# Patient Record
Sex: Male | Born: 1955 | Race: White | Hispanic: No | Marital: Married | Smoking: Current every day smoker
Health system: Southern US, Community
[De-identification: ages and names within clinical notes are randomized; demographics above are authoritative.]

---

## 2011-12-10 ENCOUNTER — Encounter (HOSPITAL_COMMUNITY): Payer: Self-pay | Admitting: Emergency Medicine

## 2011-12-10 ENCOUNTER — Emergency Department (HOSPITAL_COMMUNITY)
Admission: EM | Admit: 2011-12-10 | Discharge: 2011-12-10 | Disposition: A | Discharge status: Home / Self Care | Payer: BC Managed Care – PPO | Attending: Emergency Medicine | Admitting: Emergency Medicine

## 2011-12-10 ENCOUNTER — Emergency Department (HOSPITAL_COMMUNITY): Payer: BC Managed Care – PPO

## 2011-12-10 DIAGNOSIS — S82899A Other fracture of unspecified lower leg, initial encounter for closed fracture: Secondary | ICD-10-CM

## 2011-12-10 DIAGNOSIS — F17200 Nicotine dependence, unspecified, uncomplicated: Secondary | ICD-10-CM | POA: Insufficient documentation

## 2011-12-10 DIAGNOSIS — S82853A Displaced trimalleolar fracture of unspecified lower leg, initial encounter for closed fracture: Secondary | ICD-10-CM

## 2011-12-10 DIAGNOSIS — W1789XA Other fall from one level to another, initial encounter: Secondary | ICD-10-CM | POA: Insufficient documentation

## 2011-12-10 MED ORDER — OXYCODONE-ACETAMINOPHEN 10-650 MG PO TABS: 1.0000 | ORAL_TABLET | Freq: Four times a day (QID) | ORAL | Status: AC | PRN

## 2011-12-10 MED ORDER — KETAMINE HCL 10 MG/ML IJ SOLN
10.0000 mg | INTRAMUSCULAR | Status: AC
  Administered 2011-12-10: 50 mg via INTRAVENOUS
  Filled 2011-12-10: qty 1

## 2011-12-10 MED ORDER — HYDROMORPHONE HCL PF 1 MG/ML IJ SOLN
1.0000 mg | Freq: Once | INTRAMUSCULAR | Status: AC
  Administered 2011-12-10: 1 mg via INTRAVENOUS
  Filled 2011-12-10: qty 1

## 2011-12-10 MED ORDER — PROPOFOL 10 MG/ML IV EMUL
INTRAVENOUS | Status: AC
  Administered 2011-12-10: 50 mg/h via INTRAVENOUS
  Filled 2011-12-10: qty 20

## 2011-12-10 MED ORDER — ETOMIDATE 2 MG/ML IV SOLN: 10.0000 mg | Freq: Once | INTRAVENOUS | Status: DC

## 2011-12-10 MED ORDER — ETOMIDATE 2 MG/ML IV SOLN
INTRAVENOUS | Status: AC
  Filled 2011-12-10: qty 10

## 2011-12-10 MED ORDER — PROPOFOL 10 MG/ML IV EMUL
5.0000 mL | Freq: Once | INTRAVENOUS | Status: AC
  Administered 2011-12-10: 50 mg/h via INTRAVENOUS

## 2011-12-10 MED ORDER — KETAMINE HCL 50 MG/ML IJ SOLN
10.0000 mg | Freq: Once | INTRAMUSCULAR | Status: DC
  Filled 2011-12-10: qty 0.2

## 2011-12-10 NOTE — ED Notes (Signed)
Ortho tech called for crutches 

## 2011-12-10 NOTE — ED Notes (Signed)
Ortho tech called to come down and be on stand by

## 2011-12-10 NOTE — ED Notes (Signed)
NWG:[id number]<ZH> Expected date:12/10/11<BR> Expected time:12:30 PM<BR> Means of arrival:Ambulance<BR> Comments:<BR> Rock 3. 56 yo m. Ankle fx from fall of 2nd rung off ladder. No other trauma, no head/neck/back injury. 10mg  of morphine on. 20 min eta

## 2011-12-10 NOTE — ED Notes (Signed)
Xray at bedside to obtain portable post reduction films.

## 2011-12-10 NOTE — ED Notes (Signed)
Pt fell off 2 step of a ladder and has a deformity to the rt ankle and the foot is rotated, strong pedal pulses, foot is cold to touch, lt hand iv 20g, given morphine 10mg  iv, no loc, lsb c collar, closed fracture at this time but is beginning to pierce the skin

## 2011-12-10 NOTE — ED Provider Notes (Signed)
History     CSN: [id number]  Arrival date & time 12/10/11  1225   First MD Initiated Contact with Patient 12/10/11 1235      Chief Complaint  Patient presents with  . Ankle Pain     HPI Pt fell off 2 step of a ladder and has a deformity to the rt ankle and the foot is rotated, strong pedal pulses, foot is cold to touch, lt hand iv 20g, given morphine 10mg  iv, no loc, lsb c collar, closed fracture at this time but is beginning to pierce the skin   History reviewed. No pertinent past medical history.  History reviewed. No pertinent past surgical history.  No family history on file.  History  Substance Use Topics  . Smoking status: Current Everyday Smoker  . Smokeless tobacco: Not on file  . Alcohol Use:       Review of Systems Level V caveat applies because of need for urgent intervention Allergies  Cephalosporins and Other  Home Medications   Current Outpatient Rx  Name Route Sig Dispense Refill  . OXYCODONE-ACETAMINOPHEN 10-650 MG PO TABS Oral Take 1 tablet by mouth every 6 (six) hours as needed for pain. 30 tablet 0    BP 152/84  Pulse 82  Temp(Src) 98.3 F (36.8 C) (Oral)  Resp 13  Wt 150 lb (68.04 kg)  SpO2 99%  Physical Exam  Nursing note and vitals reviewed. Constitutional: He is oriented to person, place, and time. He appears well-developed and well-nourished. No distress.  HENT:  Head: Normocephalic and atraumatic.  Eyes: Pupils are equal, round, and reactive to light.  Neck: Normal range of motion.  Cardiovascular: Normal rate and intact distal pulses.   Pulmonary/Chest: No respiratory distress.  Abdominal: Normal appearance. He exhibits no distension.  Musculoskeletal: Normal range of motion.       Feet:  Neurological: He is alert and oriented to person, place, and time. No cranial nerve deficit.  Skin: Skin is warm and dry. No rash noted.  Psychiatric: He has a normal mood and affect. His behavior is normal.    ED Course  Reduction of  dislocation Performed by: Nelia Shi Authorized by: Nelia Shi Consent: Verbal consent obtained. Written consent not obtained. Risks and benefits: risks, benefits and alternatives were discussed Consent given by: patient Patient understanding: patient states understanding of the procedure being performed Patient consent: the patient's understanding of the procedure matches consent given Procedure consent: procedure consent matches procedure scheduled Relevant documents: relevant documents present and verified Imaging studies: imaging studies available Patient identity confirmed: verbally with patient and arm band Time out: Immediately prior to procedure a "time out" was called to verify the correct patient, procedure, equipment, support staff and site/side marked as required. Local anesthesia used: no Patient sedated: yes Sedation type: moderate (conscious) sedation Sedatives: ketamine and propofol Vitals: Vital signs were monitored during sedation. Patient tolerance: Patient tolerated the procedure well with no immediate complications. Comments: Reduction successful.  Postreduction films viewed via orthopedic Dr. and appear acceptable.  Trimalleolar fracture appears present.  Foot and ankle placed in posterior plaster splint with plaster stirrups.  Crutches to be applied.   (including critical care time)  Labs Reviewed - No data to display Dg Ankle Complete Right  12/10/2011  *RADIOLOGY REPORT*  Clinical Data: Post reduction right ankle fracture.  RIGHT ANKLE - COMPLETE 3+ VIEW  Comparison: None.  Findings: Three-view exam shows fine bony detail obscured by the overlying plaster.  The the patient has  a fracture involving the medial malleolus and distal fibula.  There may be a posterior lip fracture of the distal tibia as well, but detail is not well demonstrated on this study.  There is some slight lateral subluxation of the talar dome relative to the distal tibia.  IMPRESSION:  Probable trimalleolar fracture status post closed reduction.  Fine bony detail obscured by the overlying plaster.  Original Report Authenticated By: ERIC A. MANSELL, M.D.     1. Trimalleolar fracture   2. Fracture dislocation of ankle       MDM         Nelia Shi, MD 12/10/11 [phone]

## 2011-12-10 NOTE — ED Notes (Signed)
Pt alert and oriented, still says a few silly things from medicine for moderate sedation. But stewart score 6. Respirations even and unlabored, bilateral symmetrical rise and fall of chest. Skin warm and dry. In no acute distress. Denies needs.

## 2013-03-11 IMAGING — CR DG ANKLE COMPLETE 3+V*R*
1 series · 3 of 3 positions shown · non-contrast
Comparison: None.

CLINICAL DATA: Post reduction right ankle fracture.

RIGHT ANKLE - COMPLETE 3+ VIEW

[Series 1: AP · right · 3 of 3 slices shown]
[im 1/3]
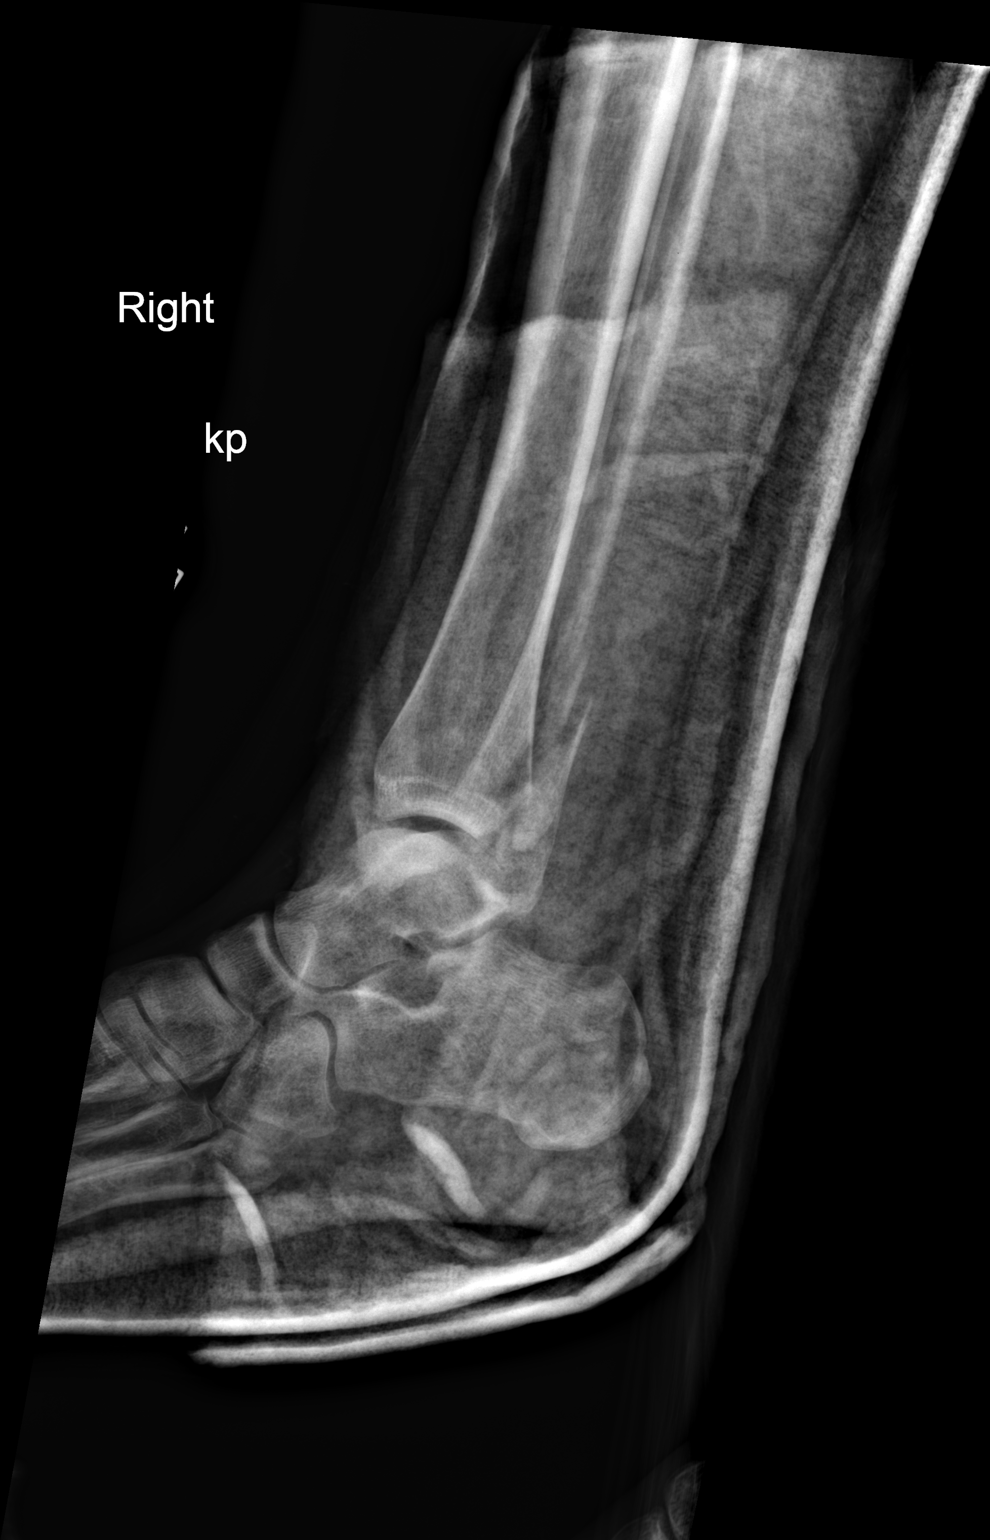
[im 2/3]
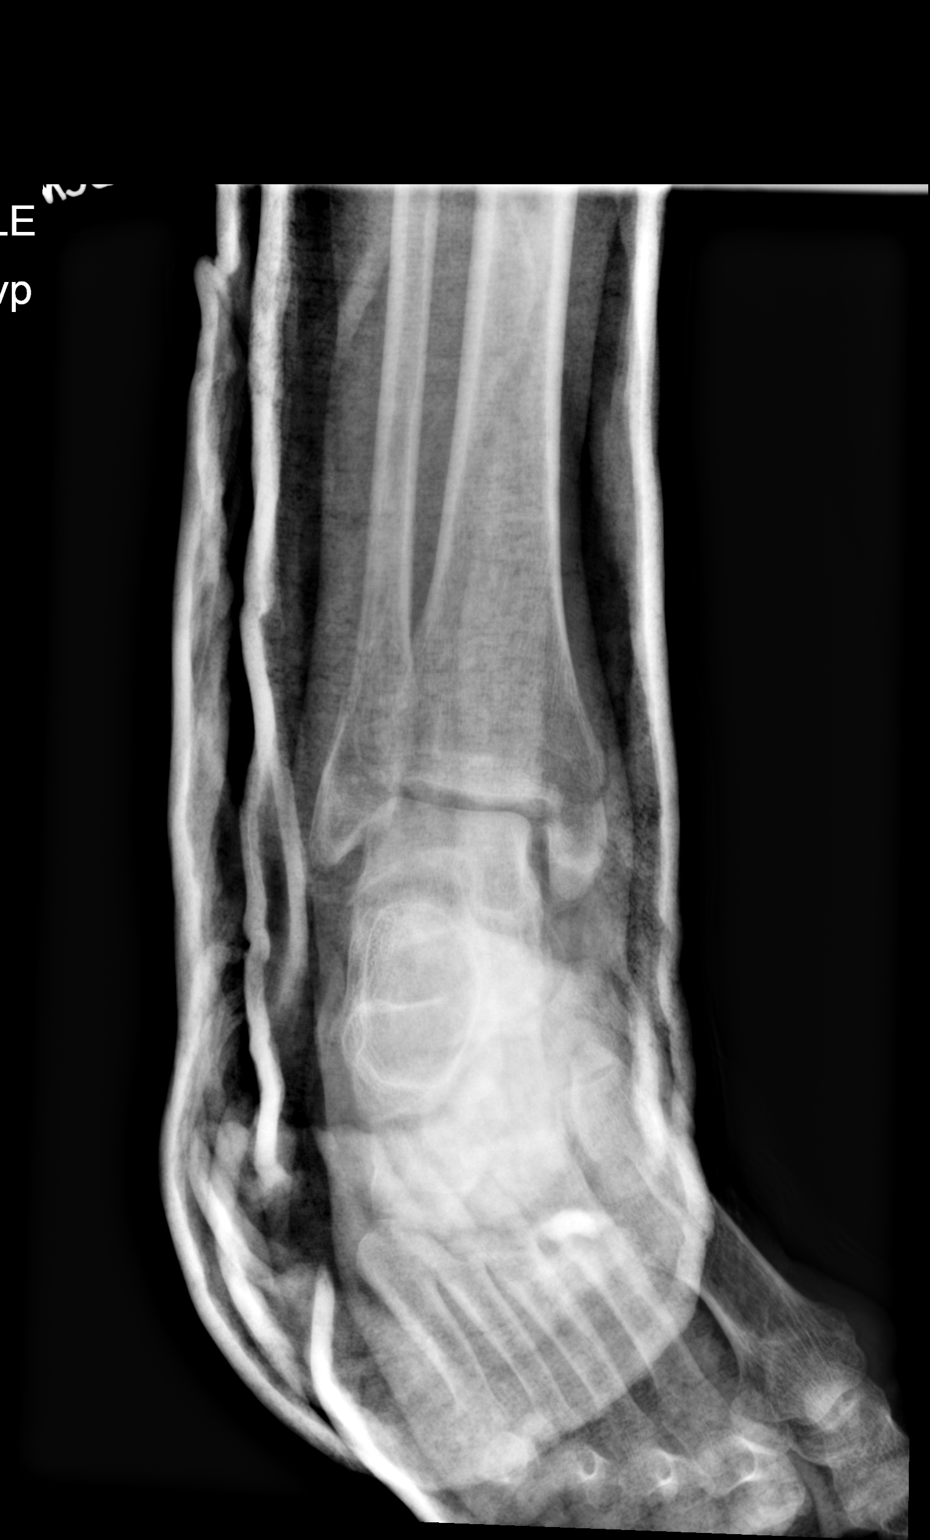
[im 3/3]
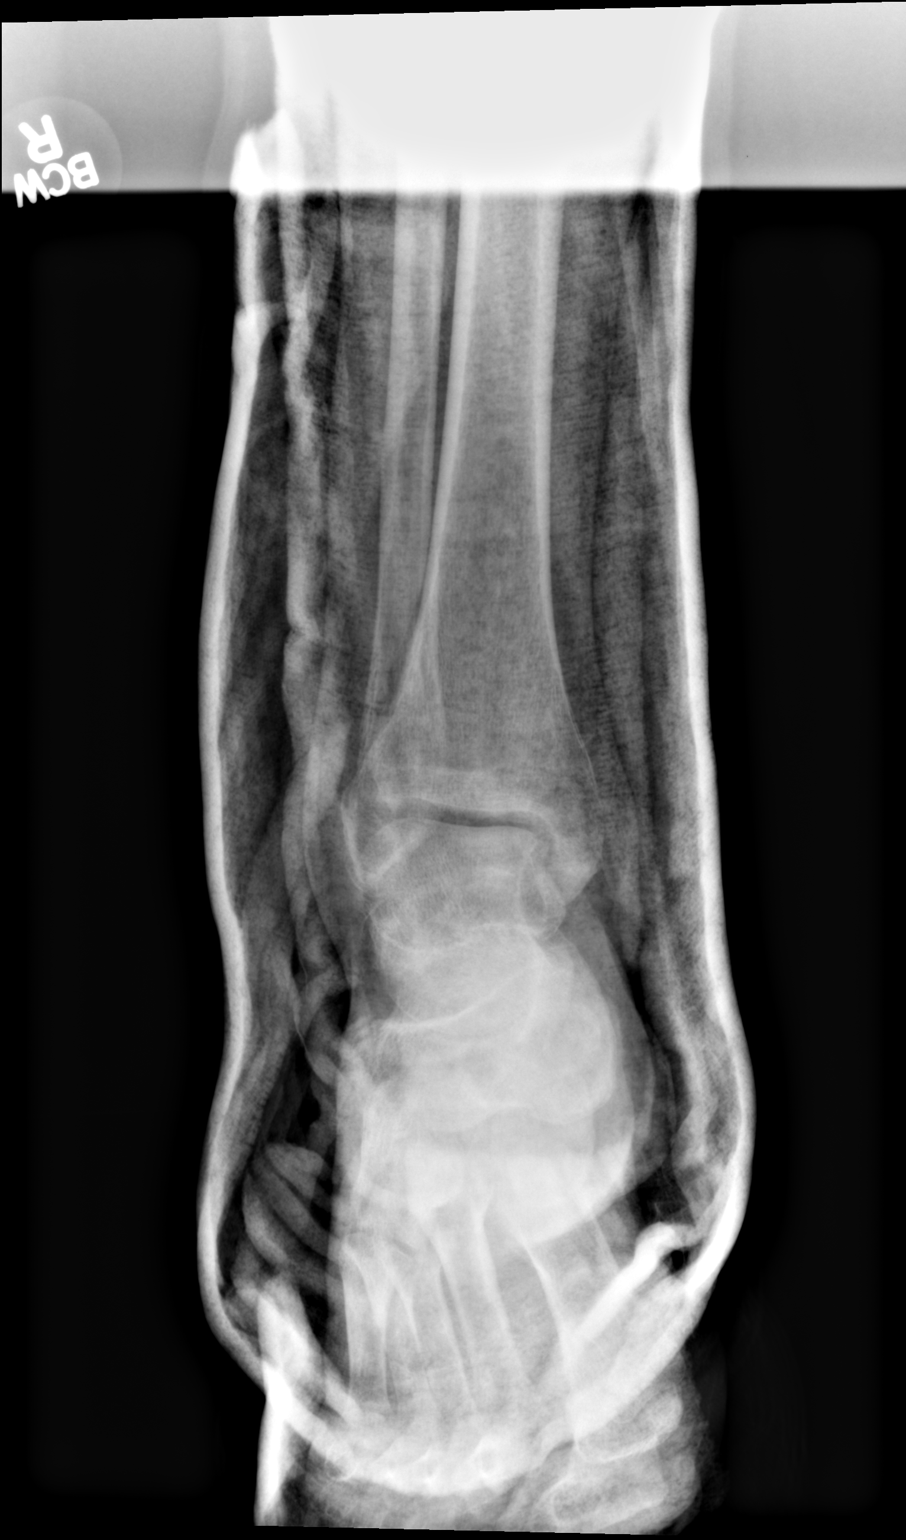

[3 of 3 positions shown; findings below may reference images not displayed]

FINDINGS: Three-view exam shows fine bony detail obscured by the
overlying plaster.  The the patient has a fracture involving the
medial malleolus and distal fibula.  There may be a posterior lip
fracture of the distal tibia as well, but detail is not well
demonstrated on this study.  There is some slight lateral
subluxation of the talar dome relative to the distal tibia.
IMPRESSION: Probable trimalleolar fracture status post closed reduction.  Fine
bony detail obscured by the overlying plaster.

## 2015-04-06 ENCOUNTER — Telehealth: Payer: Self-pay | Admitting: Family Medicine

## 2015-04-06 NOTE — Telephone Encounter (Signed)
Pt c/o hematuria and difficulty urinating H/o kidney stones Recommended ED
